# Patient Record
Sex: Male | Born: 1975 | Race: White | Hispanic: No | Marital: Single | Smoking: Current every day smoker
Health system: Southern US, Community
[De-identification: ages and names within clinical notes are randomized; demographics above are authoritative.]

## PROBLEM LIST (undated history)

## (undated) DIAGNOSIS — J449 Chronic obstructive pulmonary disease, unspecified: Secondary | ICD-10-CM

## (undated) DIAGNOSIS — M069 Rheumatoid arthritis, unspecified: Secondary | ICD-10-CM

## (undated) DIAGNOSIS — I10 Essential (primary) hypertension: Secondary | ICD-10-CM

## (undated) DIAGNOSIS — Z8739 Personal history of other diseases of the musculoskeletal system and connective tissue: Secondary | ICD-10-CM

## (undated) DIAGNOSIS — B1920 Unspecified viral hepatitis C without hepatic coma: Secondary | ICD-10-CM

## (undated) HISTORY — DX: Rheumatoid arthritis, unspecified: M06.9

---

## 2003-10-07 HISTORY — PX: VASECTOMY: SHX75

## 2017-11-09 ENCOUNTER — Other Ambulatory Visit: Payer: Self-pay

## 2017-11-09 ENCOUNTER — Encounter: Payer: Self-pay | Admitting: Emergency Medicine

## 2017-11-09 ENCOUNTER — Emergency Department
Admission: EM | Admit: 2017-11-09 | Discharge: 2017-11-09 | Disposition: A | Discharge status: Home / Self Care | Payer: Self-pay | Attending: Student in an Organized Health Care Education/Training Program | Admitting: Student in an Organized Health Care Education/Training Program

## 2017-11-09 ENCOUNTER — Emergency Department: Payer: Self-pay

## 2017-11-09 DIAGNOSIS — M5412 Radiculopathy, cervical region: Secondary | ICD-10-CM | POA: Insufficient documentation

## 2017-11-09 DIAGNOSIS — J449 Chronic obstructive pulmonary disease, unspecified: Secondary | ICD-10-CM | POA: Insufficient documentation

## 2017-11-09 DIAGNOSIS — I10 Essential (primary) hypertension: Secondary | ICD-10-CM | POA: Insufficient documentation

## 2017-11-09 DIAGNOSIS — F17210 Nicotine dependence, cigarettes, uncomplicated: Secondary | ICD-10-CM | POA: Insufficient documentation

## 2017-11-09 HISTORY — DX: Personal history of other diseases of the musculoskeletal system and connective tissue: Z87.39

## 2017-11-09 HISTORY — DX: Chronic obstructive pulmonary disease, unspecified: J44.9

## 2017-11-09 HISTORY — DX: Unspecified viral hepatitis C without hepatic coma: B19.20

## 2017-11-09 HISTORY — DX: Essential (primary) hypertension: I10

## 2017-11-09 MED ORDER — PREDNISONE 20 MG PO TABS
60.0000 mg | ORAL_TABLET | Freq: Once | ORAL | Status: AC
  Administered 2017-11-09: 60 mg via ORAL
  Filled 2017-11-09: qty 3

## 2017-11-09 MED ORDER — CYCLOBENZAPRINE HCL 10 MG PO TABS: 10.0000 mg | ORAL_TABLET | Freq: Three times a day (TID) | ORAL | 0 refills | Status: DC | PRN

## 2017-11-09 MED ORDER — CYCLOBENZAPRINE HCL 10 MG PO TABS
10.0000 mg | ORAL_TABLET | Freq: Once | ORAL | Status: AC
  Administered 2017-11-09: 10 mg via ORAL
  Filled 2017-11-09: qty 1

## 2017-11-09 MED ORDER — TRAMADOL HCL 50 MG PO TABS
50.0000 mg | ORAL_TABLET | Freq: Once | ORAL | Status: AC
  Administered 2017-11-09: 50 mg via ORAL
  Filled 2017-11-09: qty 1

## 2017-11-09 MED ORDER — METHYLPREDNISOLONE 4 MG PO TBPK: ORAL_TABLET | ORAL | 0 refills | Status: DC

## 2017-11-09 NOTE — ED Notes (Signed)
FIRST NURSE NOTE:  Pt c/o pinched nerve in back, states he has hx of chronic back pain.

## 2017-11-09 NOTE — ED Triage Notes (Signed)
Neck pain x 2 days. Denies fall or injury at onset. States increases with movement

## 2017-11-09 NOTE — ED Provider Notes (Signed)
[facility] Emergency Department Provider Note   ____________________________________________   First MD Initiated Contact with Patient 11/09/17 [phone]     (approximate)  I have reviewed the triage vital signs and the nursing notes.   HISTORY  Chief Complaint Neck Pain    HPI Nathaniel Diaz is a 42 y.o. male patient complain increasing neck pain for 2 days.  Patient denies injury.  Patient pain increased with movement.  Patient denies radicular component to his neck pain.  Patient has a history of degenerative disc disease.  Patient rates pain as a 7/10.  Patient described the pain is "aching".  No pulses measured for complaint.  Patient is in drug rehab.  Past Medical History:  Diagnosis Date  . COPD (chronic obstructive pulmonary disease) (HCC)   . H/O degenerative disc disease   . Hepatitis C   . Hypertension     There are no active problems to display for this patient.   History reviewed. No pertinent surgical history.  Prior to Admission medications   Medication Sig Start Date End Date Taking? Authorizing Provider  cyclobenzaprine (FLEXERIL) 10 MG tablet Take 1 tablet (10 mg total) by mouth 3 (three) times daily as needed. 11/09/17   Joni Reining, PA-C  methylPREDNISolone (MEDROL DOSEPAK) 4 MG TBPK tablet Take Tapered dose as directed 11/09/17   Joni Reining, PA-C    Allergies Lodine [etodolac]  No family history on file.  Social History Social History   Tobacco Use  . Smoking status: Current Every Day Smoker    Packs/day: 1.00    Types: Cigarettes  Substance Use Topics  . Alcohol use: Not on file  . Drug use: Not on file    Review of Systems Constitutional: No fever/chills Eyes: No visual changes. ENT: No sore throat. Cardiovascular: Denies chest pain. Respiratory: Denies shortness of breath. Gastrointestinal: No abdominal pain.  No nausea, no vomiting.  No diarrhea.  No constipation. Genitourinary: Negative for  dysuria. Musculoskeletal: Positive for neck pain. Skin: Negative for rash. Neurological: Negative for headaches, focal weakness or numbness. Allergic/Immunilogical: Lodine ____________________________________________   PHYSICAL EXAM:  VITAL SIGNS: ED Triage Vitals  Enc Vitals Group     BP 11/09/17 0921 (!) 150/77     Pulse Rate 11/09/17 0921 (!) 101     Resp 11/09/17 0921 20     Temp 11/09/17 0921 98.5 F (36.9 C)     Temp Source 11/09/17 0921 Oral     SpO2 11/09/17 0921 96 %     Weight 11/09/17 0922 270 lb (122.5 kg)     Height 11/09/17 0922 6\' 1"  (1.854 m)     Head Circumference --      Peak Flow --      Pain Score 11/09/17 0922 7     Pain Loc --      Pain Edu? --      Excl. in GC? --    Constitutional: Alert and oriented. Well appearing and in no acute distress. Neck: No stridor.  No obvious deformity.  Moderate guarding palpation C5 and 6.  Decreased range of motion with lateral movements.  Right paraspinal muscle spasms.  Hematological/Lymphatic/Immunilogical: No cervical lymphadenopathy. Cardiovascular: Normal rate, regular rhythm. Grossly normal heart sounds.  Good peripheral circulation.  Elevated blood pressure Respiratory: Normal respiratory effort.  No retractions. Lungs CTAB. Musculoskeletal: No lower extremity tenderness nor edema.  No joint effusions. Neurologic:  Normal speech and language. No gross focal neurologic deficits are appreciated. No gait instability.  Skin:  Skin is warm, dry and intact. No rash noted. Psychiatric: Mood and affect are normal. Speech and behavior are normal.  ____________________________________________   LABS (all labs ordered are listed, but only abnormal results are displayed)  Labs Reviewed - No data to display ____________________________________________  EKG   ____________________________________________  RADIOLOGY  ED MD interpretation: X-ray revealed moderate degenerative changes see if 5 through C7.  Official  radiology report(s): Dg Cervical Spine 2-3 Views  Result Date: 11/09/2017 CLINICAL DATA:  Pain and neck stiffness for the past 3 days. EXAM: CERVICAL SPINE - 2-3 VIEW COMPARISON:  None. FINDINGS: The lateral view is diagnostic to the C6 level. There is no acute fracture or subluxation. Vertebral body heights are preserved. Alignment is normal. Moderate disc height loss at C6-C7. Normal prevertebral soft tissues. IMPRESSION: 1. No acute osseous abnormality. Moderate degenerative disc disease at C6-C7. Electronically Signed   By: Obie Dredge M.D.   On: 11/09/2017 10:39    ____________________________________________   PROCEDURES  Procedure(s) performed: None  Procedures  Critical Care performed: No  ____________________________________________   INITIAL IMPRESSION / ASSESSMENT AND PLAN / ED COURSE  As part of my medical decision making, I reviewed the following data within the electronic MEDICAL RECORD NUMBER Notes from prior ED visits and [place] Controlled Substance Database   Neck pain secondary to degenerative changes of the cervical spine.  Chest x-ray findings with patient.  Patient given discharge care instruction.  Patient advised take medication as directed [facility]   ____________________________________________   FINAL CLINICAL IMPRESSION(S) / ED DIAGNOSES  Final diagnoses:  Cervical radiculopathy     ED Discharge Orders        Ordered    methylPREDNISolone (MEDROL DOSEPAK) 4 MG TBPK tablet     11/09/17 1103    cyclobenzaprine (FLEXERIL) 10 MG tablet  3 times daily PRN     11/09/17 1103       Note:  This document was prepared using Dragon voice recognition software and may include unintentional dictation errors.    Joni Reining, PA-C 11/09/17 1141    Willy Eddy, MD 11/09/17 [phone]

## 2017-11-09 NOTE — ED Notes (Signed)
See triage note  Presents with pain and stiffness to neck  States unsure of injury but slipped when getting out of truck  States caught self and twisted

## 2017-11-17 ENCOUNTER — Ambulatory Visit: Payer: Self-pay

## 2017-11-26 ENCOUNTER — Ambulatory Visit: Payer: Self-pay | Admitting: Urology

## 2017-11-26 VITALS — BP 125/77 | HR 113 | Temp 98.4°F | Wt 273.0 lb

## 2017-11-26 DIAGNOSIS — J449 Chronic obstructive pulmonary disease, unspecified: Secondary | ICD-10-CM

## 2017-11-26 DIAGNOSIS — M542 Cervicalgia: Secondary | ICD-10-CM | POA: Insufficient documentation

## 2017-11-26 DIAGNOSIS — M069 Rheumatoid arthritis, unspecified: Secondary | ICD-10-CM

## 2017-11-26 DIAGNOSIS — I498 Other specified cardiac arrhythmias: Secondary | ICD-10-CM

## 2017-11-26 HISTORY — DX: Rheumatoid arthritis, unspecified: M06.9

## 2017-11-26 MED ORDER — BUDESONIDE-FORMOTEROL FUMARATE 160-4.5 MCG/ACT IN AERO: 2.0000 | INHALATION_SPRAY | Freq: Two times a day (BID) | RESPIRATORY_TRACT | 12 refills | Status: AC

## 2017-11-26 MED ORDER — MELOXICAM 15 MG PO TABS: 15.0000 mg | ORAL_TABLET | Freq: Every day | ORAL | 3 refills | Status: AC

## 2017-11-26 MED ORDER — ALBUTEROL SULFATE HFA 108 (90 BASE) MCG/ACT IN AERS: 2.0000 | INHALATION_SPRAY | Freq: Four times a day (QID) | RESPIRATORY_TRACT | 2 refills | Status: AC | PRN

## 2017-11-26 NOTE — Progress Notes (Signed)
  Patient: Nathaniel Diaz Male    DOB: 1975/11/05   42 y.o.   MRN: [id number] Visit Date: 11/26/2017  Today's Provider: Michiel Cowboy, PA-C   Chief Complaint  Patient presents with  . Establish Care  . Back Pain   Subjective:    HPI 42 yo WM with multiple orthopedic problems.    Former orthopedic doctor was Dr. Durwin Nora in [place], [place].  Dx with spinal stenosis, bulging disc L4 and L5, lumbar DJD, hairline fracture of L4 and L5 vertebra and arthritis.    Was seen at [facility] ED for neck pain.  X-ray of cervical spine performed on 11/09/2017 noted no acute osseous abnormality. Moderate degenerative disc disease at C6-C7.  Has a history of COPD.  Needs refills on inhalers.  Using current inhalers sparingly since July 2018.      Allergies  Allergen Reactions  . Lodine [Etodolac] Anaphylaxis   Previous Medications   CYCLOBENZAPRINE (FLEXERIL) 10 MG TABLET    Take 1 tablet (10 mg total) by mouth 3 (three) times daily as needed.   METHYLPREDNISOLONE (MEDROL DOSEPAK) 4 MG TBPK TABLET    Take Tapered dose as directed    Review of Systems  Constitutional: Negative.   HENT: Negative.   Eyes: Negative.   Respiratory: Negative.   Cardiovascular: Negative.   Gastrointestinal: Negative.   Endocrine: Negative.   Genitourinary: Negative.   Musculoskeletal: Positive for back pain, neck pain and neck stiffness.  Skin: Negative.   Neurological: Negative.   Hematological: Negative.   Psychiatric/Behavioral: Negative.     Social History   Tobacco Use  . Smoking status: Current Every Day Smoker    Packs/day: 1.00    Types: Cigarettes  Substance Use Topics  . Alcohol use: No    Frequency: Never   Objective:   BP 125/77   Pulse (!) 113   Temp 98.4 F (36.9 C)   Wt 273 lb (123.8 kg)   BMI 36.02 kg/m   Physical Exam  Constitutional: He is oriented to person, place, and time. He appears well-developed and well-nourished.  HENT:  Head: Normocephalic and atraumatic.  Right  Ear: External ear normal.  Left Ear: External ear normal.  Nose: Nose normal.  Mouth/Throat: Oropharynx is clear and moist.  Eyes: Conjunctivae and EOM are normal. Pupils are equal, round, and reactive to light.  Neck: Normal range of motion.  Cardiovascular: Normal rate, regular rhythm and normal heart sounds.  Pulmonary/Chest: Effort normal. He has wheezes.  Abdominal: Soft. Bowel sounds are normal.  Musculoskeletal: Normal range of motion.  Neurological: He is alert and oriented to person, place, and time.  Skin: Skin is warm and dry.  Psychiatric: He has a normal mood and affect. His behavior is normal. Judgment and thought content normal.        Assessment & Plan:     1. DJD  - needs appointment with Dr. Justice Rocher  - taking 800 mg tid of ibuprofen daily - has had relief with meloxicam   2. COPD  - refills given for albuterol and Symbicort   3. History of heart arrhythmia  - order EKG  4. History of Hep C  - ordered Hep C antibody and ethanol  Also obtain routine labs: TSH, HbgA1c, CBC, CMP and lipids   RTC in one month      Michiel Cowboy, PA-C   [facility]

## 2017-12-01 ENCOUNTER — Telehealth: Payer: Self-pay | Admitting: Adult Health Nurse Practitioner

## 2017-12-01 NOTE — Telephone Encounter (Signed)
Needs to be rescheduled for ortho appointment

## 2017-12-02 ENCOUNTER — Other Ambulatory Visit: Payer: Self-pay | Admitting: Urology

## 2017-12-02 ENCOUNTER — Ambulatory Visit: Payer: Self-pay | Admitting: Specialist

## 2017-12-02 DIAGNOSIS — B1920 Unspecified viral hepatitis C without hepatic coma: Secondary | ICD-10-CM

## 2017-12-02 LAB — COMPREHENSIVE METABOLIC PANEL
ALBUMIN: 5 g/dL (ref 3.5–5.5)
ALK PHOS: 63 IU/L (ref 39–117)
ALT: 47 IU/L — ABNORMAL HIGH (ref 0–44)
AST: 36 IU/L (ref 0–40)
Albumin/Globulin Ratio: 2.2 (ref 1.2–2.2)
BUN / CREAT RATIO: 16 (ref 9–20)
BUN: 20 mg/dL (ref 6–24)
Bilirubin Total: 0.5 mg/dL (ref 0.0–1.2)
CHLORIDE: 103 mmol/L (ref 96–106)
CO2: 27 mmol/L (ref 20–29)
Calcium: 9.9 mg/dL (ref 8.7–10.2)
Creatinine, Ser: 1.25 mg/dL (ref 0.76–1.27)
GFR calc Af Amer: 82 mL/min/{1.73_m2} (ref >59)
GFR calc non Af Amer: 71 mL/min/{1.73_m2} (ref >59)
GLOBULIN, TOTAL: 2.3 g/dL (ref 1.5–4.5)
Glucose: 63 mg/dL — ABNORMAL LOW (ref 65–99)
Potassium: 4.7 mmol/L (ref 3.5–5.2)
SODIUM: 145 mmol/L — AB (ref 134–144)
Total Protein: 7.3 g/dL (ref 6.0–8.5)

## 2017-12-02 LAB — LIPID PANEL
CHOL/HDL RATIO: 4.2 ratio (ref 0.0–5.0)
Cholesterol, Total: 198 mg/dL (ref 100–199)
HDL: 47 mg/dL (ref >39)
LDL Calculated: 121 mg/dL — ABNORMAL HIGH (ref 0–99)
Triglycerides: 150 mg/dL — ABNORMAL HIGH (ref 0–149)
VLDL Cholesterol Cal: 30 mg/dL (ref 5–40)

## 2017-12-02 LAB — CBC WITH DIFFERENTIAL/PLATELET
BASOS ABS: 0 10*3/uL (ref 0.0–0.2)
Basos: 0 %
EOS (ABSOLUTE): 0.2 10*3/uL (ref 0.0–0.4)
EOS: 2 %
HEMATOCRIT: 46.9 % (ref 37.5–51.0)
Hemoglobin: 16.1 g/dL (ref 13.0–17.7)
Immature Grans (Abs): 0 10*3/uL (ref 0.0–0.1)
Immature Granulocytes: 0 %
LYMPHS ABS: 3.6 10*3/uL — AB (ref 0.7–3.1)
Lymphs: 31 %
MCH: 32.8 pg (ref 26.6–33.0)
MCHC: 34.3 g/dL (ref 31.5–35.7)
MCV: 96 fL (ref 79–97)
MONOS ABS: 0.8 10*3/uL (ref 0.1–0.9)
Monocytes: 7 %
Neutrophils Absolute: 6.9 10*3/uL (ref 1.4–7.0)
Neutrophils: 60 %
PLATELETS: 185 10*3/uL (ref 150–379)
RBC: 4.91 x10E6/uL (ref 4.14–5.80)
RDW: 14 % (ref 12.3–15.4)
WBC: 11.5 10*3/uL — AB (ref 3.4–10.8)

## 2017-12-02 LAB — HEPATITIS C ANTIBODY

## 2017-12-02 LAB — ETHANOL: ETHANOL LVL: NEGATIVE %

## 2017-12-02 LAB — TSH: TSH: 2.87 u[IU]/mL (ref 0.450–4.500)

## 2017-12-02 LAB — HEMOGLOBIN A1C
ESTIMATED AVERAGE GLUCOSE: 97 mg/dL
HEMOGLOBIN A1C: 5 % (ref 4.8–5.6)

## 2017-12-02 NOTE — Progress Notes (Unsigned)
Referral placed for Dr. Servando Snare.

## 2017-12-04 ENCOUNTER — Ambulatory Visit: Payer: Medicaid Other

## 2017-12-09 ENCOUNTER — Ambulatory Visit: Payer: Self-pay | Admitting: Specialist

## 2017-12-09 DIAGNOSIS — M545 Low back pain, unspecified: Secondary | ICD-10-CM

## 2017-12-09 NOTE — Progress Notes (Signed)
   Subjective:    Patient ID: Nathaniel Diaz, male    DOB: 03-10-1976, 42 y.o.   MRN: [id number]  HPI 42 yo comes in telling me that he has spinal stenosis, DJD, and disk degeneration. He had been recommended by Dr. Alla German to have an instrumented fusion. He has now applied for SSDI. He is unable to work for a long period of time. His pain is mainly axial. 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 I have advised him of the dangers of the long term NASID's usage. He has been to PT and has used a TENS.     Review of Systems deferred    Objective:   Physical Exam  deferred      Assessment & Plan:  Plan: All I have to offer is a referral to [facility]. His last MRI was several years ago, so if he desires the referral I would order a new MRI. He will consider his options and return PRN.

## 2017-12-10 ENCOUNTER — Ambulatory Visit: Payer: Self-pay | Admitting: Ophthalmology

## 2017-12-15 ENCOUNTER — Telehealth: Payer: Self-pay

## 2017-12-15 NOTE — Telephone Encounter (Signed)
-----   Message from Lewie Loron, NP sent at 12/10/2017  3:00 PM EST ----- Call patient and let him know his bad cholesterol a little elevated. He should also decrease his salt intake.  He needs to exercise more and we will be referring him to the [facility] clinic. The last provider recommended he returns to the clinic in 1 month Thanks.

## 2017-12-15 NOTE — Telephone Encounter (Signed)
Called to give pt lab results. No answer and no vm set up.

## 2017-12-22 ENCOUNTER — Ambulatory Visit: Payer: Medicaid Other | Admitting: Pharmacy Technician

## 2017-12-24 ENCOUNTER — Ambulatory Visit: Payer: Self-pay

## 2018-01-06 ENCOUNTER — Encounter: Payer: Self-pay | Admitting: Internal Medicine

## 2018-01-06 ENCOUNTER — Ambulatory Visit: Payer: Self-pay | Admitting: Internal Medicine

## 2018-01-06 VITALS — BP 146/88 | HR 83 | Temp 98.0°F | Wt 284.5 lb

## 2018-01-06 DIAGNOSIS — J329 Chronic sinusitis, unspecified: Secondary | ICD-10-CM

## 2018-01-06 MED ORDER — SULFAMETHOXAZOLE-TRIMETHOPRIM 800-160 MG PO TABS: 1.0000 | ORAL_TABLET | Freq: Two times a day (BID) | ORAL | 0 refills | Status: DC

## 2018-01-06 MED ORDER — CETIRIZINE HCL 10 MG PO TABS: 10.0000 mg | ORAL_TABLET | Freq: Every day | ORAL | 0 refills | Status: AC

## 2018-01-06 MED ORDER — FLUTICASONE PROPIONATE 50 MCG/ACT NA SUSP: 2.0000 | Freq: Every day | NASAL | 0 refills | Status: AC

## 2018-01-06 NOTE — Progress Notes (Signed)
   Subjective:    Patient ID: Nathaniel Diaz, male    DOB: 07-11-1976, 42 y.o.   MRN: [id number]  HPI  Pt states he has some head congestion. He has ringing in his ears that has been there for a few days. Pt has some chest congestions as well.   Patient Active Problem List   Diagnosis Date Noted  . Rheumatoid arthritis (HCC) 11/26/2017  . Spine pain, cervical     Current Outpatient Medications on File Prior to Visit  Medication Sig Dispense Refill  . albuterol (PROVENTIL HFA;VENTOLIN HFA) 108 (90 Base) MCG/ACT inhaler Inhale 2 puffs into the lungs every 6 (six) hours as needed for wheezing or shortness of breath. 1 Inhaler 2  . budesonide-formoterol (SYMBICORT) 160-4.5 MCG/ACT inhaler Inhale 2 puffs into the lungs 2 (two) times daily. 1 Inhaler 12  . meloxicam (MOBIC) 15 MG tablet Take 1 tablet (15 mg total) by mouth daily. 30 tablet 3  . cyclobenzaprine (FLEXERIL) 10 MG tablet Take 1 tablet (10 mg total) by mouth 3 (three) times daily as needed. (Patient not taking: Reported on 11/26/2017) 15 tablet 0  . methylPREDNISolone (MEDROL DOSEPAK) 4 MG TBPK tablet Take Tapered dose as directed (Patient not taking: Reported on 11/26/2017) 21 tablet 0   No current facility-administered medications on file prior to visit.       Review of Systems    BP (!) 146/88   Pulse 83   Temp 98 F (36.7 C)   Wt 284 lb 8 oz (129 kg)   BMI 37.54 kg/m   Objective:   Physical Exam  Constitutional: He is oriented to person, place, and time.  Cardiovascular: Normal rate, regular rhythm and normal heart sounds.  Pulmonary/Chest: Effort normal. He has wheezes.  Neurological: He is alert and oriented to person, place, and time.          Assessment & Plan:  Ears look clear. Nasal passages are red and inflamed. Normal cardiac rhythm. Heart rate around 70. Inspiratory and exspiratory wheezes. Pt is aware of Hep C DX. GI referral was done and appt has been made. Pt is aware. Rx's sent to pharmacy.  RTC prn.

## 2018-01-07 ENCOUNTER — Encounter: Payer: Medicaid Other | Admitting: Pharmacist

## 2018-01-13 ENCOUNTER — Encounter: Payer: Self-pay | Admitting: Pharmacist

## 2018-01-13 ENCOUNTER — Other Ambulatory Visit: Payer: Self-pay

## 2018-01-13 ENCOUNTER — Ambulatory Visit: Payer: Medicaid Other | Admitting: Pharmacy Technician

## 2018-01-13 ENCOUNTER — Ambulatory Visit: Payer: Medicaid Other | Admitting: Pharmacist

## 2018-01-13 VITALS — BP 148/72 | Ht 73.0 in | Wt 272.0 lb

## 2018-01-13 DIAGNOSIS — Z79899 Other long term (current) drug therapy: Secondary | ICD-10-CM

## 2018-01-13 NOTE — Progress Notes (Addendum)
[facility] Management Clinic Visit Note  Patient: Nathaniel Diaz MRN: [id number] Date of Birth: August 17, 1976 PCP: Kallie Locks, FNP   Helen Hashimoto 42 y.o. male presents for a medication therapy management visit with the pharmacist today.  BP (!) 148/72   Ht 6\' 1"  (1.854 m)   Wt 272 lb (123.4 kg)   BMI 35.89 kg/m   Patient Information   Past Medical History:  Diagnosis Date  . COPD (chronic obstructive pulmonary disease) (HCC)   . H/O degenerative disc disease   . Hepatitis C   . Hypertension   . Rheumatoid arthritis (HCC) 11/26/2017      Past Surgical History:  Procedure Laterality Date  . VASECTOMY  2005     Family History  Problem Relation Age of Onset  . Alcohol abuse Mother   . Mental illness Mother   . Cancer Mother   . Alcohol abuse Father   . Mental illness Father   . Cancer Father   . Mental illness Sister     New Diagnoses (since last visit): NA  Family Support: Comments:not discussed today    Social History   Substance and Sexual Activity  Alcohol Use No  . Frequency: Never      Social History   Tobacco Use  Smoking Status Current Every Day Smoker  . Packs/day: 1.00  . Types: Cigarettes      Health Maintenance  Topic Date Due  . HIV Screening  02/18/1990  . TETANUS/TDAP  02/18/1994  . INFLUENZA VACCINE  05/06/2018   Outpatient Encounter Medications as of 01/13/2018  Medication Sig  . albuterol (PROVENTIL HFA;VENTOLIN HFA) 108 (90 Base) MCG/ACT inhaler Inhale 2 puffs into the lungs every 6 (six) hours as needed for wheezing or shortness of breath.  . cetirizine (ZYRTEC ALLERGY) 10 MG tablet Take 1 tablet (10 mg total) by mouth daily.  . fluticasone (FLONASE) 50 MCG/ACT nasal spray Place 2 sprays into both nostrils daily.  . meloxicam (MOBIC) 15 MG tablet Take 1 tablet (15 mg total) by mouth daily.  . budesonide-formoterol (SYMBICORT) 160-4.5 MCG/ACT inhaler Inhale 2 puffs into the lungs 2 (two) times daily.  .  cyclobenzaprine (FLEXERIL) 10 MG tablet Take 1 tablet (10 mg total) by mouth 3 (three) times daily as needed. (Patient not taking: Reported on 11/26/2017)  . [DISCONTINUED] methylPREDNISolone (MEDROL DOSEPAK) 4 MG TBPK tablet Take Tapered dose as directed (Patient not taking: Reported on 11/26/2017)  . [DISCONTINUED] sulfamethoxazole-trimethoprim (BACTRIM DS,SEPTRA DS) 800-160 MG tablet Take 1 tablet by mouth 2 (two) times daily. (Patient not taking: Reported on 01/13/2018)   No facility-administered encounter medications on file as of 01/13/2018.      Assessment and Plan:  Medication Compliance: Patient is able to correctly state his medications, what they are used for, and correct inhaler technique. He had his medications filled recently at [facility] while waiting to complete his eligibility requirements.    COPD: Patient taking albuterol (last filled at [facility]) 3-4 times daily as needed, currently attempting to order Symbicort and Proventil through [facility].   RA: Patient currently taking meloxicam, well tolerated and well controlled.    Other: Blood pressure was elevated in the office today. Patient states that he was previously on lisinopril but stopped taking it (along with other medications) due to his life circumstances. He has been seen at [facility] and will ask about restarting blood pressure medications at his next visit.   [facility], PharmD Candidate  RTC: 1 year  Cosigned:Christan [place],  PharmD, RPh [facility]) [phone]

## 2018-01-13 NOTE — Progress Notes (Signed)
  Completed [facility] Management Clinic application and contract.  Patient agreed to all terms of the [facility] Clinic contract.    Patient approved to receive medication assistance at [facility] through 2019, as long as eligibility criteria continues to be met.    Provided patient with Civil engineer, contracting based on his particular needs.    Symbicort and Proventil Prescription Applications completed with patient.  Forwarded to [facility] for signature.  Upon receipt of signed applications from provider, Symbicort Prescription Application will be submitted to [facility] and Proventil Prescription Application will be submitted to [facility].Marland Kitchen  [facility]

## 2018-01-26 ENCOUNTER — Telehealth: Payer: Self-pay | Admitting: Pharmacist

## 2018-01-26 ENCOUNTER — Ambulatory Visit (INDEPENDENT_AMBULATORY_CARE_PROVIDER_SITE_OTHER): Payer: Medicaid Other | Admitting: Gastroenterology

## 2018-01-26 ENCOUNTER — Encounter: Payer: Self-pay | Admitting: Gastroenterology

## 2018-01-26 VITALS — BP 153/88 | HR 76 | Ht 73.0 in | Wt 279.4 lb

## 2018-01-26 DIAGNOSIS — B182 Chronic viral hepatitis C: Secondary | ICD-10-CM

## 2018-01-26 NOTE — Telephone Encounter (Signed)
/  23/2019 9:18:58 AM - Proventil HFA  01/26/18 Mailing [employer] application for [employer] HFA 0.09mg  Inhale 2 puffs into the lungs 4 times a day.Forde Radon

## 2018-01-26 NOTE — Telephone Encounter (Signed)
01/26/2018 10:38:47 AM - Symbicort 160/4.5  01/26/18 Faxing [employer] application for Symbicort 160/4.5 mcg Inhale 2 puffs into the lungs twice a day.Forde Radon

## 2018-01-26 NOTE — Progress Notes (Signed)
Gastroenterology Consultation  Referring Provider:     Hulan Fray Primary Care Physician:  Kallie Locks, FNP Primary Gastroenterologist:  Dr. Servando Snare     Reason for Consultation:     Hepatitis C        HPI:   Nathaniel Diaz is a 42 y.o. y/o male referred for consultation & management of Hepatitis C by Dr. Bradly Chris, Rolm Gala, FNP.  This patient comes in today after being told that he had hepatitis C.  The patient states he contracted it in jail from sharing straws and snorting drugs.  The patient also has multiple tattoos.  The patient states he was negative before going to jail and knows he contracted in jail.  The patient has had no insurance and had a workup in the past by [employer] around where he he used to live. The patient denies any abdominal pain nausea vomiting fevers or chills.  The patient is now here for treatment of his hepatitis C.  Past Medical History:  Diagnosis Date  . COPD (chronic obstructive pulmonary disease) (HCC)   . H/O degenerative disc disease   . Hepatitis C   . Hypertension   . Rheumatoid arthritis (HCC) 11/26/2017    Past Surgical History:  Procedure Laterality Date  . VASECTOMY  2005    Prior to Admission medications   Medication Sig Start Date End Date Taking? Authorizing Provider  albuterol (PROVENTIL HFA;VENTOLIN HFA) 108 (90 Base) MCG/ACT inhaler Inhale 2 puffs into the lungs every 6 (six) hours as needed for wheezing or shortness of breath. 11/26/17  Yes McGowan, Carollee Herter A, PA-C  fluticasone (FLONASE) 50 MCG/ACT nasal spray Place 2 sprays into both nostrils daily. 01/06/18  Yes Virl Axe, MD  meloxicam (MOBIC) 15 MG tablet Take 1 tablet (15 mg total) by mouth daily. 11/26/17  Yes McGowan, Carollee Herter A, PA-C  budesonide-formoterol (SYMBICORT) 160-4.5 MCG/ACT inhaler Inhale 2 puffs into the lungs 2 (two) times daily. Patient not taking: Reported on 01/26/2018 11/26/17   Michiel Cowboy A, PA-C  cetirizine (ZYRTEC  ALLERGY) 10 MG tablet Take 1 tablet (10 mg total) by mouth daily. Patient not taking: Reported on 01/26/2018 01/06/18   Virl Axe, MD    Family History  Problem Relation Age of Onset  . Alcohol abuse Mother   . Mental illness Mother   . Cancer Mother   . Alcohol abuse Father   . Mental illness Father   . Cancer Father   . Mental illness Sister      Social History   Tobacco Use  . Smoking status: Current Every Day Smoker    Packs/day: 1.00    Types: Cigarettes  . Smokeless tobacco: Never Used  Substance Use Topics  . Alcohol use: No    Frequency: Never  . Drug use: No    Allergies as of 01/26/2018 - Review Complete 01/26/2018  Allergen Reaction Noted  . Lodine [etodolac] Anaphylaxis 11/09/2017    Review of Systems:    All systems reviewed and negative except where noted in HPI.   Physical Exam:  BP (!) 153/88   Pulse 76   Ht 6\' 1"  (1.854 m)   Wt 279 lb 6.4 oz (126.7 kg)   BMI 36.86 kg/m  No LMP for male patient. Psych:  Alert and cooperative. Normal mood and affect. General:   Alert,  Well-developed, well-nourished, pleasant and cooperative in NAD Head:  Normocephalic and atraumatic. Eyes:  Sclera clear, no icterus.   Conjunctiva  pink. Ears:  Normal auditory acuity. Nose:  No deformity, discharge, or lesions. Mouth:  No deformity or lesions,oropharynx pink & moist. Neck:  Supple; no masses or thyromegaly. Lungs:  Respirations even and unlabored.  Clear throughout to auscultation.   No wheezes, crackles, or rhonchi. No acute distress. Heart:  Regular rate and rhythm; no murmurs, clicks, rubs, or gallops. Abdomen:  Normal bowel sounds.  No bruits.  Soft, non-tender and non-distended without masses, hepatosplenomegaly or hernias noted.  No guarding or rebound tenderness.  Negative Carnett sign.   Rectal:  Deferred.  Msk:  Symmetrical without gross deformities.  Good, equal movement & strength bilaterally. Pulses:  Normal pulses noted. Extremities:  No clubbing  or edema.  No cyanosis. Neurologic:  Alert and oriented x3;  grossly normal neurologically. Skin:  Intact without significant lesions or rashes.  No jaundice. Lymph Nodes:  No significant cervical adenopathy. Psych:  Alert and cooperative. Normal mood and affect.  Imaging Studies: No results found.  Assessment and Plan:   Nathaniel Diaz is a 42 y.o. y/o male who comes in with a history of hepatitis C.  The patient has antibody positive HCV. The patient will have his previous lab sent for and whatever is missing will be tested.  The patient will then be applied for compassionate care medication from the drug company to treat his hepatitis C unless he gets insurance in the interim.  The patient has been explained the plan and agrees with it.  Midge Minium, MD. Clementeen Graham   Note: This dictation was prepared with Dragon dictation along with smaller phrase technology. Any transcriptional errors that result from this process are unintentional.

## 2018-02-11 ENCOUNTER — Other Ambulatory Visit: Payer: Self-pay

## 2018-02-12 ENCOUNTER — Telehealth: Payer: Self-pay

## 2018-02-12 NOTE — Telephone Encounter (Signed)
Tried contacting pt regarding labs received from previous physician. Will need to order additional labs. Voicemail box was not set up to leave a message.

## 2018-04-14 ENCOUNTER — Telehealth: Payer: Self-pay | Admitting: Pharmacist

## 2018-04-14 NOTE — Telephone Encounter (Signed)
04/14/2018 2:51:55 PM - Symbicort refill  04/14/18 Called [employer] for refill on Symbicort 160/4.5 mcg--allow 7-10 business days to receive--2 refills left.Forde Radon

## 2018-05-14 ENCOUNTER — Telehealth: Payer: Self-pay | Admitting: Pharmacist

## 2018-05-14 NOTE — Telephone Encounter (Signed)
05/14/2018 12:13:22 PM - Proventil HFA refill  05/14/18 Called [employer] for refill on Proventil HFA, Rx# [id number] 7-10 business days to receive.Forde Radon

## 2018-08-04 IMAGING — CR DG CERVICAL SPINE 2 OR 3 VIEWS
4 series · 4 of 4 positions shown · non-contrast
Comparison: None.

CLINICAL DATA: Pain and neck stiffness for the past 3 days.

EXAM:
CERVICAL SPINE - 2-3 VIEW

[c-spine lat]
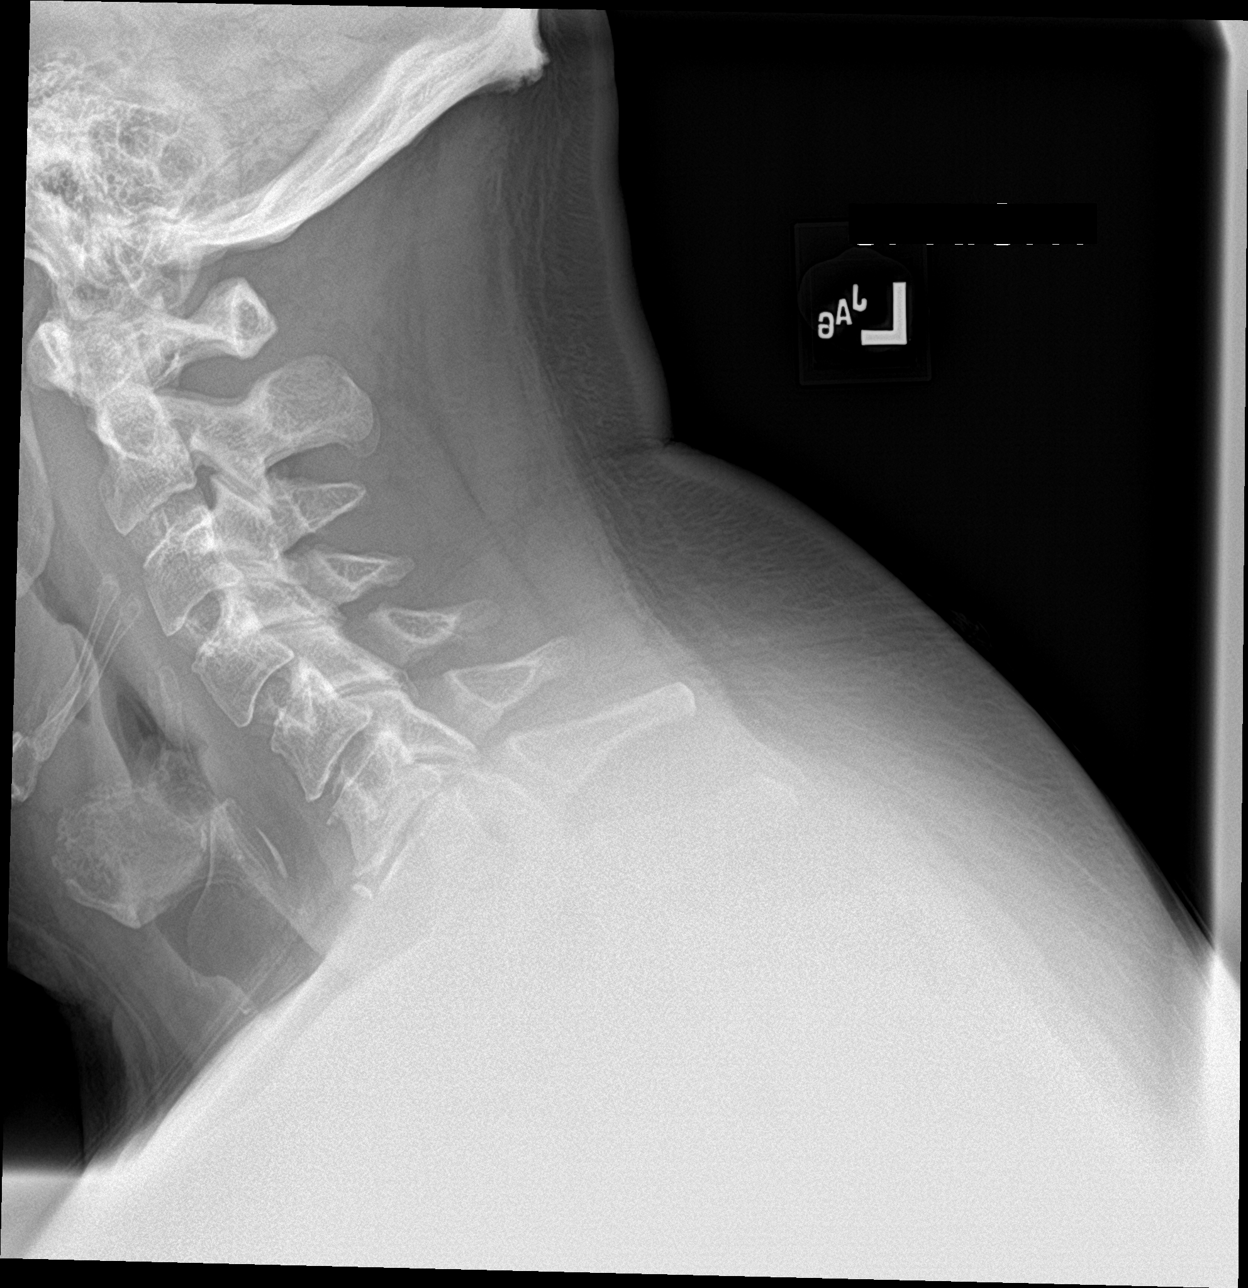

[c-spine ap]
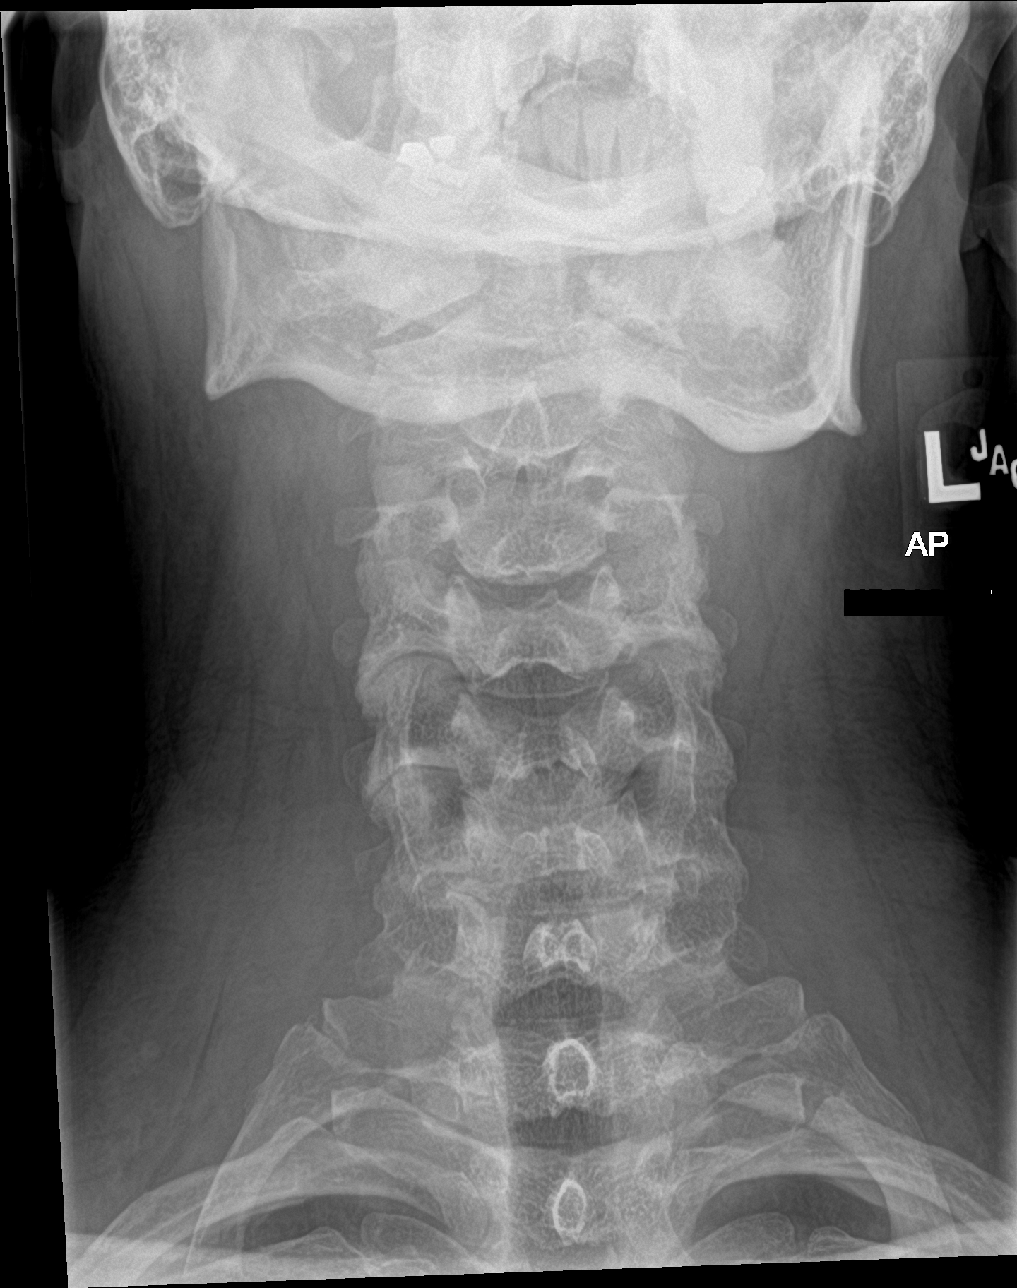

[c-spine open mouth]
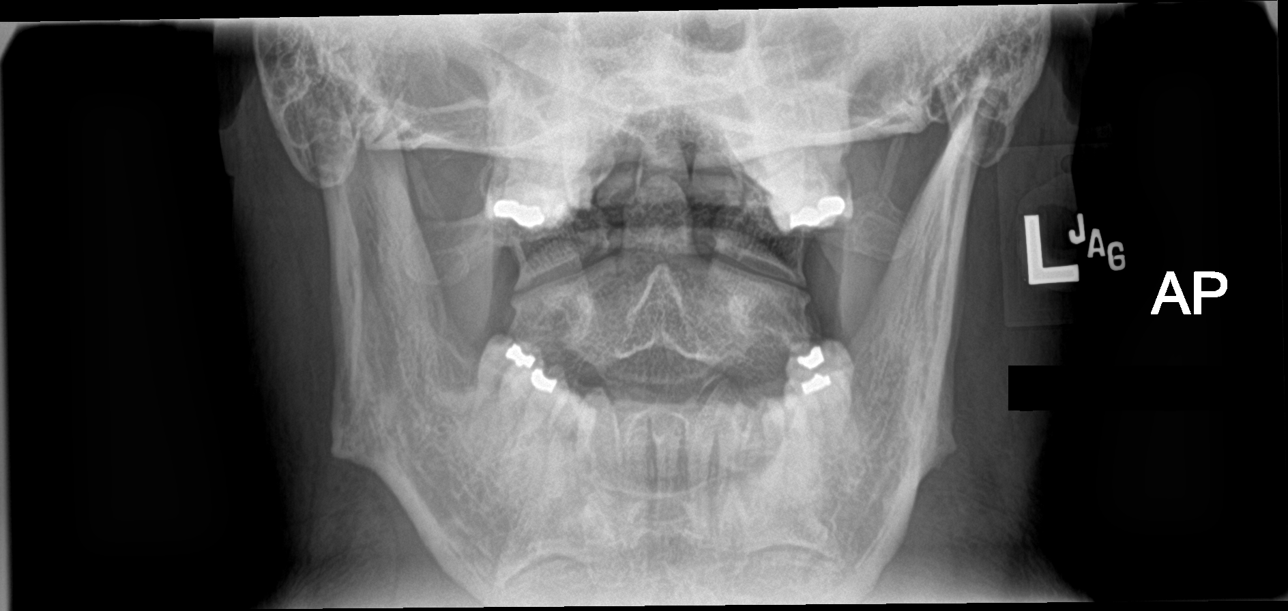

[c-spine swimmers]
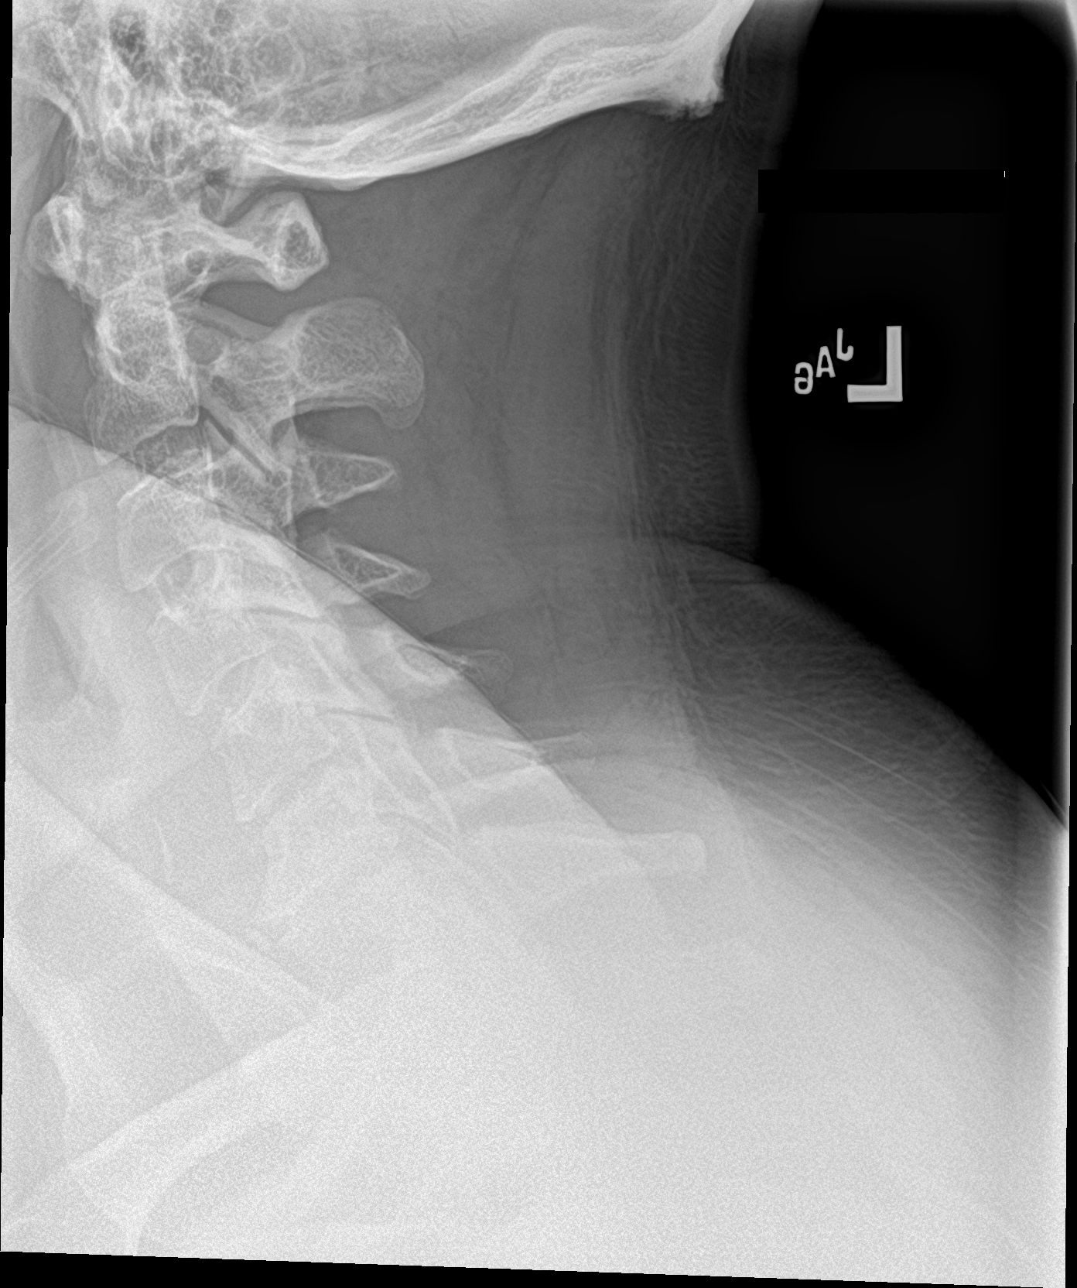

[4 of 4 positions shown; findings below may reference images not displayed]

FINDINGS: The lateral view is diagnostic to the C6 level. There is no acute
fracture or subluxation. Vertebral body heights are preserved.
Alignment is normal. Moderate disc height loss at C6-C7.. Normal
prevertebral soft tissues.
IMPRESSION: 1. No acute osseous abnormality. Moderate degenerative disc disease
at C6-C7.

## 2018-12-03 ENCOUNTER — Telehealth: Payer: Self-pay | Admitting: Pharmacist

## 2018-12-03 NOTE — Telephone Encounter (Signed)
12/03/2018 6:38:49 AM - Proventil HFA RTS Non pickup  12/03/2018 Received [employer] invoice dated 08/31/2018 where we received (3) Proventil HFA - note on invoice R[facility] for non pick up.Nathaniel Diaz
